# Patient Record
Sex: Female | Born: 1945 | Race: Black or African American | Hispanic: No | Marital: Married | Smoking: Current every day smoker
Health system: Southern US, Community
[De-identification: ages and names within clinical notes are randomized; demographics above are authoritative.]

## PROBLEM LIST (undated history)

## (undated) DIAGNOSIS — E119 Type 2 diabetes mellitus without complications: Secondary | ICD-10-CM

## (undated) DIAGNOSIS — I10 Essential (primary) hypertension: Secondary | ICD-10-CM

## (undated) HISTORY — PX: TONSILECTOMY/ADENOIDECTOMY WITH MYRINGOTOMY: SHX6125

---

## 2014-08-04 ENCOUNTER — Encounter (HOSPITAL_COMMUNITY): Payer: Self-pay | Admitting: Emergency Medicine

## 2014-08-04 ENCOUNTER — Emergency Department (HOSPITAL_COMMUNITY)
Admission: EM | Admit: 2014-08-04 | Discharge: 2014-08-05 | Disposition: A | Discharge status: Home / Self Care | Payer: Medicare Other | Attending: Emergency Medicine | Admitting: Emergency Medicine

## 2014-08-04 ENCOUNTER — Emergency Department (HOSPITAL_COMMUNITY): Payer: Medicare Other

## 2014-08-04 DIAGNOSIS — R002 Palpitations: Secondary | ICD-10-CM | POA: Insufficient documentation

## 2014-08-04 DIAGNOSIS — Z7982 Long term (current) use of aspirin: Secondary | ICD-10-CM | POA: Insufficient documentation

## 2014-08-04 DIAGNOSIS — R011 Cardiac murmur, unspecified: Secondary | ICD-10-CM | POA: Insufficient documentation

## 2014-08-04 DIAGNOSIS — R040 Epistaxis: Secondary | ICD-10-CM | POA: Diagnosis not present

## 2014-08-04 DIAGNOSIS — Z720 Tobacco use: Secondary | ICD-10-CM | POA: Diagnosis not present

## 2014-08-04 DIAGNOSIS — I10 Essential (primary) hypertension: Secondary | ICD-10-CM | POA: Diagnosis not present

## 2014-08-04 DIAGNOSIS — E119 Type 2 diabetes mellitus without complications: Secondary | ICD-10-CM | POA: Insufficient documentation

## 2014-08-04 DIAGNOSIS — Z79899 Other long term (current) drug therapy: Secondary | ICD-10-CM | POA: Diagnosis not present

## 2014-08-04 HISTORY — DX: Type 2 diabetes mellitus without complications: E11.9

## 2014-08-04 HISTORY — DX: Essential (primary) hypertension: I10

## 2014-08-04 LAB — COMPREHENSIVE METABOLIC PANEL
ALT: 18 U/L (ref 0–35)
AST: 20 U/L (ref 0–37)
Albumin: 3.8 g/dL (ref 3.5–5.2)
Alkaline Phosphatase: 48 U/L (ref 39–117)
Anion gap: 12 (ref 5–15)
BUN: 11 mg/dL (ref 6–23)
CALCIUM: 10.3 mg/dL (ref 8.4–10.5)
CO2: 25 meq/L (ref 19–32)
Chloride: 104 mEq/L (ref 96–112)
Creatinine, Ser: 0.65 mg/dL (ref 0.50–1.10)
GLUCOSE: 131 mg/dL — AB (ref 70–99)
Potassium: 3.5 mEq/L — ABNORMAL LOW (ref 3.7–5.3)
Sodium: 141 mEq/L (ref 137–147)
Total Bilirubin: <0.2 mg/dL — ABNORMAL LOW (ref 0.3–1.2)
Total Protein: 8.1 g/dL (ref 6.0–8.3)

## 2014-08-04 LAB — I-STAT TROPONIN, ED: TROPONIN I, POC: 0 ng/mL (ref 0.00–0.08)

## 2014-08-04 LAB — CBC
HEMATOCRIT: 36.7 % (ref 36.0–46.0)
Hemoglobin: 11.8 g/dL — ABNORMAL LOW (ref 12.0–15.0)
MCH: 28.4 pg (ref 26.0–34.0)
MCHC: 32.2 g/dL (ref 30.0–36.0)
MCV: 88.2 fL (ref 78.0–100.0)
Platelets: 240 10*3/uL (ref 150–400)
RBC: 4.16 MIL/uL (ref 3.87–5.11)
RDW: 13.1 % (ref 11.5–15.5)
WBC: 9.9 10*3/uL (ref 4.0–10.5)

## 2014-08-04 LAB — APTT: APTT: 36 s (ref 24–37)

## 2014-08-04 MED ORDER — LIDOCAINE-EPINEPHRINE (PF) 1 %-1:200000 IJ SOLN
INTRAMUSCULAR | Status: AC
  Administered 2014-08-05: 30 mL
  Filled 2014-08-04: qty 30

## 2014-08-04 MED ORDER — OXYMETAZOLINE HCL 0.05 % NA SOLN
1.0000 | Freq: Once | NASAL | Status: AC
  Administered 2014-08-04: 1 via NASAL
  Filled 2014-08-04: qty 15

## 2014-08-04 NOTE — ED Notes (Addendum)
Per EMS pt. From home with complaint of nose bleeding which started around 945 this evening , pt. Is also hypertensive with BP of 206/17816mmhf., pt. Denies headache. Pt. Just had her BP medicines adjusted by her Primary  MD. Denies SOB . Pt. Is not on blood thinner.

## 2014-08-04 NOTE — ED Notes (Signed)
Bed: [id number] Expected date:  Expected time:  Means of arrival:  Comments: EMS 21F nosebleed, hypertensive

## 2014-08-04 NOTE — ED Notes (Addendum)
writter got the Vitals pt.

## 2014-08-05 NOTE — ED Notes (Signed)
Dr. Hyacinth MeekerMiller said disreguard EKG

## 2014-08-05 NOTE — Discharge Instructions (Signed)
Follow up with an otolaryngologist in [place]. You may use the one provided above, or choose another if you prefer. Follow-up with otolaryngology in 3-5 days. If your bleeding worsens, return to the nearest ER for further evaluation. Follow-up with her primary care physician regarding your hypertension and palpitations. Continue taking your home prescribed medications, and return to the ER if he develop any shortness of breath, diaphoresis, chest pain, lightheadedness or persistent palpitations.   Nosebleed Nosebleeds can be caused by many conditions, including trauma, infections, polyps, foreign bodies, dry mucous membranes or climate, medicines, and air conditioning. Most nosebleeds occur in the front of the nose. Because of this location, most nosebleeds can be controlled by pinching the nostrils gently and continuously for at least 10 to 20 minutes. The long, continuous pressure allows enough time for the blood to clot. If pressure is released during that 10 to 20 minute time period, the process may have to be started again. The nosebleed may stop by itself or quit with pressure, or it may need concentrated heating (cautery) or pressure from packing. HOME CARE INSTRUCTIONS   If your nose was packed, try to maintain the pack inside until your health care provider removes it. If a gauze pack was used and it starts to fall out, gently replace it or cut the end off. Do not cut if a balloon catheter was used to pack the nose. Otherwise, do not remove unless instructed.  Avoid blowing your nose for 12 hours after treatment. This could dislodge the pack or clot and start the bleeding again.  If the bleeding starts again, sit up and bend forward, gently pinching the front half of your nose continuously for 20 minutes.  If bleeding was caused by dry mucous membranes, use over-the-counter saline nasal spray or gel. This will keep the mucous membranes moist and allow them to heal. If you must use a  lubricant, choose the water-soluble variety. Use it only sparingly and not within several hours of lying down.  Do not use petroleum jelly or mineral oil, as these may drip into the lungs and cause serious problems.  Maintain humidity in your home by using less air conditioning or by using a humidifier.  Do not use aspirin or medicines which make bleeding more likely. Your health care provider can give you recommendations on this.  Resume normal activities as you are able, but try to avoid straining, lifting, or bending at the waist for several days.  If the nosebleeds become recurrent and the cause is unknown, your health care provider may suggest laboratory tests. SEEK MEDICAL CARE IF: You have a fever. SEEK IMMEDIATE MEDICAL CARE IF:   Bleeding recurs and cannot be controlled.  There is unusual bleeding from or bruising on other parts of the body.  Nosebleeds continue.  There is any worsening of the condition which originally brought you in.  You become light-headed, feel faint, become sweaty, or vomit blood. MAKE SURE YOU:   Understand these instructions.  Will watch your condition.  Will get help right away if you are not doing well or get worse. Document Released: 06/04/2005 Document Revised: 01/09/2014 Document Reviewed: 07/26/2009 [facility] Patient Information 2015 [place], [place]. This information is not intended to replace advice given to you by your health care provider. Make sure you discuss any questions you have with your health care provider.  Palpitations A palpitation is the feeling that your heartbeat is irregular or is faster than normal. It may feel like your heart is fluttering  or skipping a beat. Palpitations are usually not a serious problem. However, in some cases, you may need further medical evaluation. CAUSES  Palpitations can be caused by:  Smoking.  Caffeine or other stimulants, such as diet pills or energy drinks.  Alcohol.  Stress and  anxiety.  Strenuous physical activity.  Fatigue.  Certain medicines.  Heart disease, especially if you have a history of irregular heart rhythms (arrhythmias), such as atrial fibrillation, atrial flutter, or supraventricular tachycardia.  An improperly working pacemaker or defibrillator. DIAGNOSIS  To find the cause of your palpitations, your health care provider will take your medical history and perform a physical exam. Your health care provider may also have you take a test called an ambulatory electrocardiogram (ECG). An ECG records your heartbeat patterns over a 24-hour period. You may also have other tests, such as:  Transthoracic echocardiogram (TTE). During echocardiography, sound waves are used to evaluate how blood flows through your heart.  Transesophageal echocardiogram (TEE).  Cardiac monitoring. This allows your health care provider to monitor your heart rate and rhythm in real time.  Holter monitor. This is a portable device that records your heartbeat and can help diagnose heart arrhythmias. It allows your health care provider to track your heart activity for several days, if needed.  Stress tests by exercise or by giving medicine that makes the heart beat faster. TREATMENT  Treatment of palpitations depends on the cause of your symptoms and can vary greatly. Most cases of palpitations do not require any treatment other than time, relaxation, and monitoring your symptoms. Other causes, such as atrial fibrillation, atrial flutter, or supraventricular tachycardia, usually require further treatment. HOME CARE INSTRUCTIONS   Avoid:  Caffeinated coffee, tea, soft drinks, diet pills, and energy drinks.  Chocolate.  Alcohol.  Stop smoking if you smoke.  Reduce your stress and anxiety. Things that can help you relax include:  A method of controlling things in your body, such as your heartbeats, with your mind (biofeedback).  Yoga.  Meditation.  Physical activity  such as swimming, jogging, or walking.  Get plenty of rest and sleep. SEEK MEDICAL CARE IF:   You continue to have a fast or irregular heartbeat beyond 24 hours.  Your palpitations occur more often. SEEK IMMEDIATE MEDICAL CARE IF:  You have chest pain or shortness of breath.  You have a severe headache.  You feel dizzy or you faint. MAKE SURE YOU:  Understand these instructions.  Will watch your condition.  Will get help right away if you are not doing well or get worse. Document Released: 08/22/2000 Document Revised: 08/30/2013 Document Reviewed: 10/24/2011 [facility] Patient Information 2015 [place], [place]. This information is not intended to replace advice given to you by your health care provider. Make sure you discuss any questions you have with your health care provider.

## 2014-08-05 NOTE — ED Provider Notes (Signed)
CSN: [id number]     Arrival date & time 08/04/14  2225 History   First MD Initiated Contact with Patient 08/04/14 2302     Chief Complaint  Patient presents with  . Epistaxis  . Hypertension     (Consider location/radiation/quality/duration/timing/severity/associated sxs/prior Treatment) HPI Rose Graves is a 68 year old female with past medical history of hypertension, diabetes who presents the ER with an episode of epistaxis and hypertension. Patient reports her epistaxis began approximately 9:00 PM tonight spontaneously. Patient states recently she has been sneezing, coughing more frequently. Patient states she also has had a recent change of her blood pressure regimen, however this was changed approximately 10 days ago by her PCP.  Patient states she has been compliant with her hypertensive medications. Patient also reports recently having intermittent palpitations for the past several weeks. Patient states these occur on daily basis lately. Patient states today she explains one episode this morning. Patient states the palpitations only occur when she is lying still. She denies any associated chest pain, shortness of breath, diaphoresis, nausea, vomiting, lightheadedness with her palpitations. Patient denies any headache, dizziness, blurred vision, weakness, chest pain, shortness of breath, abdominal pain, nausea, vomiting, diarrhea, dysuria.  Past Medical History  Diagnosis Date  . Hypertension   . Diabetes mellitus without complication    Past Surgical History  Procedure Laterality Date  . Tonsilectomy/adenoidectomy with myringotomy     History reviewed. No pertinent family history. History  Substance Use Topics  . Smoking status: Current Every Day Smoker -- 0.50 packs/day    Types: Cigarettes  . Smokeless tobacco: Not on file  . Alcohol Use: No   OB History    No data available     Review of Systems  Constitutional: Negative for fever.  HENT: Positive for nosebleeds.  Negative for trouble swallowing.   Eyes: Negative for visual disturbance.  Respiratory: Negative for shortness of breath.   Cardiovascular: Positive for palpitations. Negative for chest pain.  Gastrointestinal: Negative for nausea, vomiting and abdominal pain.  Genitourinary: Negative for dysuria.  Musculoskeletal: Negative for neck pain.  Skin: Negative for rash.  Neurological: Negative for dizziness, weakness, numbness and headaches.  Psychiatric/Behavioral: Negative.       Allergies  Review of patient's allergies indicates no known allergies.  Home Medications   Prior to Admission medications   Medication Sig Start Date End Date Taking? Authorizing Provider  aspirin EC 81 MG tablet Take 81 mg by mouth daily.   Yes Historical Provider, MD  atenolol (TENORMIN) 100 MG tablet Take 100 mg by mouth daily.   Yes Historical Provider, MD  chlorthalidone (HYGROTON) 25 MG tablet Take 12.5 mg by mouth daily.   Yes Historical Provider, MD  citalopram (CELEXA) 10 MG tablet Take 10 mg by mouth daily.   Yes Historical Provider, MD  diazepam (VALIUM) 2 MG tablet Take 1-2 mg by mouth 2 (two) times daily as needed for anxiety.   Yes Historical Provider, MD  metFORMIN (GLUCOPHAGE-XR) 500 MG 24 hr tablet Take 500 mg by mouth daily with breakfast.   Yes Historical Provider, MD  Multiple Vitamin (MULTIVITAMIN WITH MINERALS) TABS tablet Take 1 tablet by mouth daily.   Yes Historical Provider, MD  quinapril (ACCUPRIL) 40 MG tablet Take 40 mg by mouth at bedtime.   Yes Historical Provider, MD  triamterene-hydrochlorothiazide (MAXZIDE) 75-50 MG per tablet Take 1 tablet by mouth daily.   Yes Historical Provider, MD   BP 168/111 mmHg  Pulse 71  Temp(Src) 98.4 F (36.9 C) (Oral)  Resp 19  SpO2 100% Physical Exam  Constitutional: She is oriented to person, place, and time. She appears well-developed and well-nourished. No distress.  HENT:  Head: Normocephalic and atraumatic.  Nose: No nasal deformity.  Epistaxis is observed. Right sinus exhibits no maxillary sinus tenderness and no frontal sinus tenderness. Left sinus exhibits no maxillary sinus tenderness and no frontal sinus tenderness.  Mouth/Throat: Oropharynx is clear and moist. No oropharyngeal exudate.  Mild to moderate epistaxis with constant, slow hemorrhage in the posterior arterial plexus.  Eyes: EOM are normal. Pupils are equal, round, and reactive to light. Right eye exhibits no discharge. Left eye exhibits no discharge. No scleral icterus.  Neck: Normal range of motion.  Cardiovascular: Normal rate, regular rhythm, S1 normal and S2 normal.   Murmur heard.  Systolic murmur is present with a grade of 2/6  Pulses:      Radial pulses are 2+ on the right side, and 2+ on the left side.       Dorsalis pedis pulses are 2+ on the right side, and 2+ on the left side.  Pulmonary/Chest: Effort normal and breath sounds normal. No accessory muscle usage. No tachypnea. No respiratory distress.  Abdominal: Soft. Normal appearance and bowel sounds are normal. There is no tenderness.  Musculoskeletal: Normal range of motion. She exhibits no edema or tenderness.  Neurological: She is alert and oriented to person, place, and time. No cranial nerve deficit. Coordination normal.  Skin: Skin is warm and dry. No rash noted. She is not diaphoretic.  Psychiatric: She has a normal mood and affect.  Nursing note and vitals reviewed.   ED Course  EPISTAXIS MANAGEMENT Date/Time: 08/05/2014 12:43 AM Performed by: Monte FantasiaMINTZ, JOSEPH W Authorized by: Monte FantasiaMINTZ, JOSEPH W Consent: Verbal consent obtained. Risks and benefits: risks, benefits and alternatives were discussed Consent given by: patient Patient identity confirmed: verbally with patient Time out: Immediately prior to procedure a "time out" was called to verify the correct patient, procedure, equipment, support staff and site/side marked as required. Treatment site: left posterior Repair method: nasal  balloon Treatment complexity: simple Patient tolerance: Patient tolerated the procedure well with no immediate complications   (including critical care time) Labs Review Labs Reviewed  CBC - Abnormal; Notable for the following:    Hemoglobin 11.8 (*)    All other components within normal limits  COMPREHENSIVE METABOLIC PANEL - Abnormal; Notable for the following:    Potassium 3.5 (*)    Glucose, Bld 131 (*)    Total Bilirubin <0.2 (*)    All other components within normal limits  APTT  I-STAT TROPOININ, ED    Imaging Review Dg Chest 2 View  08/05/2014   CLINICAL DATA:  Epistaxis beginning this evening, hypertension, palpitations.  EXAM: CHEST  2 VIEW  COMPARISON:  None.  FINDINGS: Cardiac silhouette is unremarkable. Mildly tortuous aorta can be seen with hypertension. No pleural effusions or focal consolidations. No pneumothorax. Soft tissue planes and included osseous structures are nonsuspicious. Mild degenerative change of thoracic spine.  IMPRESSION: No acute cardiopulmonary process.   Electronically Signed   By: Awilda Metroourtnay  Bloomer   On: 08/05/2014 01:07     EKG Interpretation None      MDM   Final diagnoses:  Palpitations  Posterior epistaxis    Patient here with episode of epistaxis which began around 9:00 PM this evening. Patient reporting having episodes of sneezing and coughing recently and having dryness in her nose. Patient also reporting episode of hypertension the night with blood  pressure measured at 206/116 by EMS. Patient denying having any symptoms other than the epistaxis tonight. Patient reported one episode of palpitations earlier today, and states these palpitations have been recurrent over the past several weeks, happens several times a week. Patient states she only notices these when she is lying still. Presentation of these palpitations are non concerning for anginal equivalent due to the fact the patient is denying any chest pain, shortness of breath,  increase in her symptoms with exertion. Also because patient has had a negative troponin tonight, negative chest x-ray, and EKG unremarkable for any ectopy or injury. Renal function within normal limits. Chest x-ray unremarkable for any acute pathology. No concern for end organ damage associated with hypertension. No concern for hypertension urgency or emergency.  Afrin attempted to patient's epistaxis which did not appear to effectively control bleeding. Balloon placed due to to patient's epistaxis appearing from posterior arterial plexus as noted in procedure note above.  On reexamination, patient posterior epistaxis has decreased substantially, there is still a small amount of blood in the posterior pharynx from postnasal drip. Patient stating she is on vacation this weekend, and [place]. I advised patient that she would need to follow up with otolaryngology for removal of her balloon, and for follow-up with her epistaxis. I will provide patient with a name and number of an otolaryngologist [place] to contact. I discussed return precautions with patient including worsening of symptoms of her epistaxis, palpitations or signs or symptoms of hypertensive urgency or emergency. Patient was agreeable to this plan. I encouraged patient to call or return to the ER should she have any worsening of symptoms or should she have any questions or concerns.  BP 168/111 mmHg  Pulse 71  Temp(Src) 98.4 F (36.9 C) (Oral)  Resp 19  SpO2 100%  Signed,  Ladona MowJoe Karilyn Wind, PA-C 2:39 AM  Patient seen and discussed with Dr. Eber HongBrian Miller, MD.     Monte FantasiaJoseph W July Nickson, PA-C 08/05/14 0239  Monte FantasiaJoseph W Patsey Pitstick, PA-C 08/05/14 0240  Vida RollerBrian D Miller, MD 08/05/14 [phone]

## 2014-08-05 NOTE — ED Provider Notes (Signed)
68 year old female, on baby aspirin, presents with one day of epistaxis, this is left-sided, dripping down her throat, she is coughing up blood. The symptoms are persistent, nothing makes it better or worse, she denies frequent coughing, sneezing or picking and has had no injuries. On exam the patient is moderately hypertensive, she has blood in the posterior pharynx, there is no blood in the anterior left nares. Posterior packing placed, see physician assistant note, I was in the bed directing the procedure the entire time. Hemoglobin unremarkable  Medical screening examination/treatment/procedure(s) were conducted as a shared visit with non-physician practitioner(s) and myself.  I personally evaluated the patient during the encounter.  Clinical Impression:   Final diagnoses:  Palpitations  Posterior epistaxis         Vida RollerBrian D Reyes [place], MD 08/05/14 2342

## 2015-10-05 IMAGING — CR DG CHEST 2V
2 series · 2 of 2 positions shown · non-contrast
Comparison: None.

CLINICAL DATA: Epistaxis beginning this evening, hypertension,
palpitations.

EXAM:
CHEST  2 VIEW

[w chest pa]
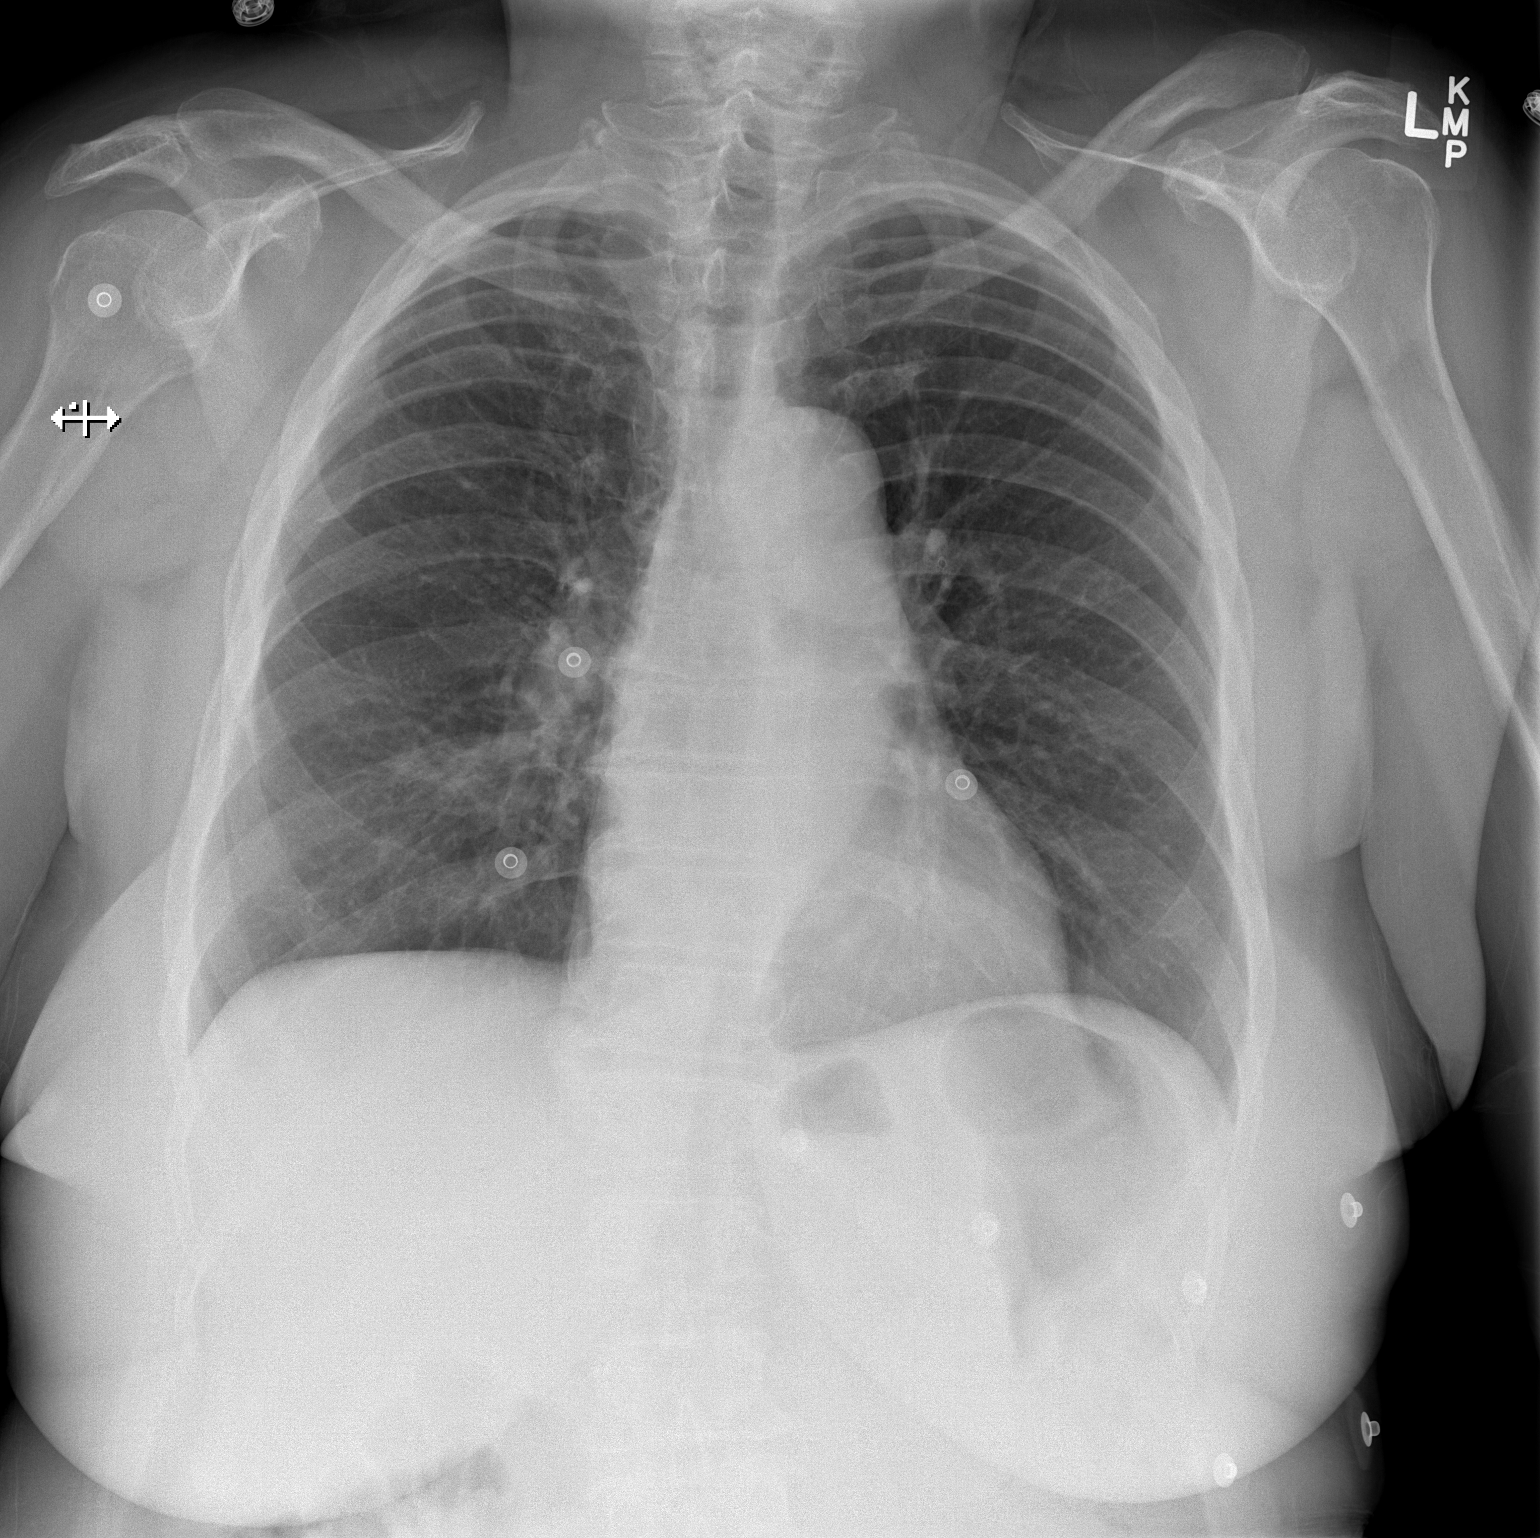

[w chest lat]
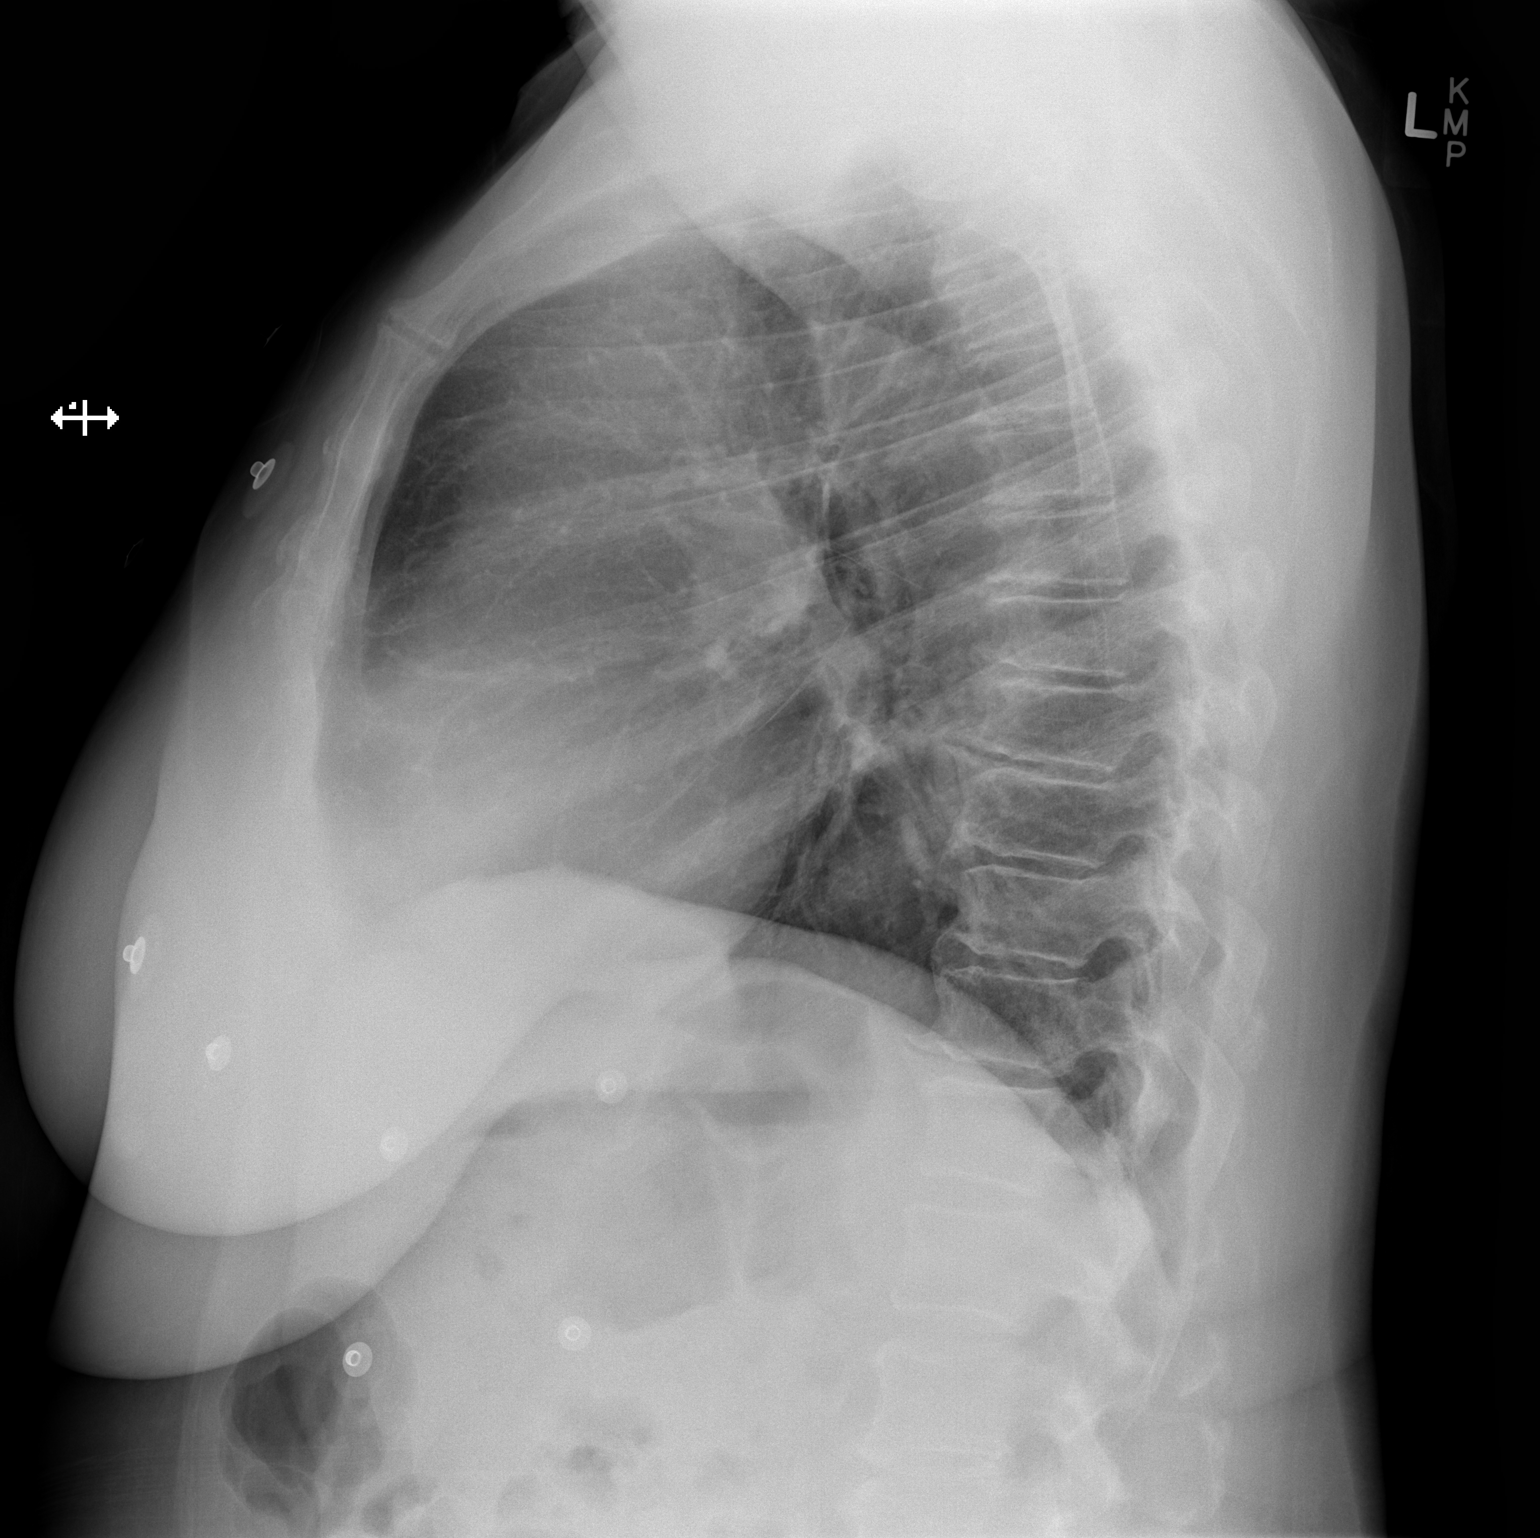

[2 of 2 positions shown; findings below may reference images not displayed]

FINDINGS: Cardiac silhouette is unremarkable. Mildly tortuous aorta can be
seen with hypertension. No pleural effusions or focal
consolidations. No pneumothorax. Soft tissue planes and included
osseous structures are nonsuspicious. Mild degenerative change of
thoracic spine.
IMPRESSION: No acute cardiopulmonary process.

  By: Andriean Leoha
